# Patient Record
Sex: Female | Born: 1955 | Race: White | Hispanic: No | Marital: Married
Health system: Midwestern US, Community
[De-identification: ages and names within clinical notes are randomized; demographics above are authoritative.]

## PROBLEM LIST (undated history)

## (undated) DIAGNOSIS — R1312 Dysphagia, oropharyngeal phase: Secondary | ICD-10-CM

---

## 2015-10-15 ENCOUNTER — Encounter (HOSPITAL_BASED_OUTPATIENT_CLINIC_OR_DEPARTMENT_OTHER): Payer: Self-pay | Admitting: *Deleted

## 2015-10-15 ENCOUNTER — Emergency Department (HOSPITAL_BASED_OUTPATIENT_CLINIC_OR_DEPARTMENT_OTHER)
Admission: EM | Admit: 2015-10-15 | Discharge: 2015-10-15 | Disposition: A | Discharge status: Home / Self Care | Payer: BLUE CROSS/BLUE SHIELD | Attending: Physician Assistant | Admitting: Physician Assistant

## 2015-10-15 ENCOUNTER — Emergency Department (HOSPITAL_BASED_OUTPATIENT_CLINIC_OR_DEPARTMENT_OTHER): Payer: BLUE CROSS/BLUE SHIELD

## 2015-10-15 DIAGNOSIS — R55 Syncope and collapse: Secondary | ICD-10-CM | POA: Insufficient documentation

## 2015-10-15 DIAGNOSIS — R1110 Vomiting, unspecified: Secondary | ICD-10-CM | POA: Diagnosis not present

## 2015-10-15 DIAGNOSIS — R05 Cough: Secondary | ICD-10-CM | POA: Diagnosis present

## 2015-10-15 DIAGNOSIS — J189 Pneumonia, unspecified organism: Secondary | ICD-10-CM | POA: Insufficient documentation
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
## 2015-10-15 NOTE — ED Notes (Signed)
Pt reports cough x 1 week. Recent bus trip to [place] and cruise to [place]. Cough started after episode of possible aspiration

## 2015-10-15 NOTE — Discharge Instructions (Signed)
We think your cough is likely from inhaling a food product into your lung.  You may need to have Pulmonology do a scope to look at your lungs if you continue to cough.  We also recommend you establish care with a PCP. There are PCP in this office building that you can follow up with!  Return if cough continues,worsens or you have fever.

## 2016-03-04 ENCOUNTER — Emergency Department (HOSPITAL_BASED_OUTPATIENT_CLINIC_OR_DEPARTMENT_OTHER)
Admission: EM | Admit: 2016-03-04 | Discharge: 2016-03-04 | Disposition: A | Discharge status: Home / Self Care | Payer: BLUE CROSS/BLUE SHIELD | Attending: Emergency Medicine | Admitting: Emergency Medicine

## 2016-03-04 ENCOUNTER — Encounter (HOSPITAL_BASED_OUTPATIENT_CLINIC_OR_DEPARTMENT_OTHER): Payer: Self-pay | Admitting: *Deleted

## 2016-03-04 ENCOUNTER — Emergency Department (HOSPITAL_BASED_OUTPATIENT_CLINIC_OR_DEPARTMENT_OTHER): Payer: BLUE CROSS/BLUE SHIELD

## 2016-03-04 DIAGNOSIS — M545 Low back pain, unspecified: Secondary | ICD-10-CM

## 2016-03-04 DIAGNOSIS — R1110 Vomiting, unspecified: Secondary | ICD-10-CM | POA: Diagnosis not present

## 2016-03-04 MED ORDER — NAPROXEN 375 MG PO TABS: 375.0000 mg | ORAL_TABLET | Freq: Two times a day (BID) | ORAL | Status: AC

## 2016-03-04 MED ORDER — ONDANSETRON 4 MG PO TBDP: ORAL_TABLET | ORAL | Status: AC

## 2016-03-04 MED ORDER — TRAMADOL HCL 50 MG PO TABS: 50.0000 mg | ORAL_TABLET | Freq: Four times a day (QID) | ORAL | Status: AC | PRN

## 2016-03-04 MED ORDER — KETOROLAC TROMETHAMINE 15 MG/ML IJ SOLN
15.0000 mg | Freq: Once | INTRAMUSCULAR | Status: AC
  Administered 2016-03-04: 15 mg via INTRAVENOUS
  Filled 2016-03-04: qty 1

## 2016-03-04 MED ORDER — FENTANYL CITRATE (PF) 100 MCG/2ML IJ SOLN
100.0000 ug | Freq: Once | INTRAMUSCULAR | Status: AC
  Administered 2016-03-04: 100 ug via INTRAVENOUS
  Filled 2016-03-04: qty 2

## 2016-03-04 MED ORDER — CYCLOBENZAPRINE HCL 10 MG PO TABS
10.0000 mg | ORAL_TABLET | Freq: Two times a day (BID) | ORAL | Status: AC | PRN
Start: 2016-03-04 — End: ?

## 2016-03-04 NOTE — ED Provider Notes (Signed)
CSN: [id number]     Arrival date & time 03/04/16  1725 History  By signing my name below, I, Rosario AdieWilliam Andrew Hiatt, attest that this documentation has been prepared under the direction and in the presence of Pricilla LovelessScott Munirah Doerner, MD.  Electronically Signed: Rosario AdieWilliam Andrew Hiatt, ED Scribe. 03/04/2016. 6:55 PM.   Chief Complaint  Patient presents with  . Back Pain   The history is provided by the patient. No language interpreter was used.   HPI Comments: Jessica Kent is a 60 y.o. female with no pertinent PMHx who presents to the Emergency Department complaining of gradual onset, gradually worsening, constant, 10/10, left-sided lower back pain onset approximately 10 hours PTA. Her pain radiates up her spine and into her buttocks. She has had one episode of emesis PTA, which she states is because of her pain. Pt reports that she has taken Motrin, an unknown muscle relaxant, and applied heat with no relief of her pain. She notes that her pain is slightly alleviated when she lays down and doesn't move. Her pain is exacerbated with positional changes and ambulation. No known trauma or injury to the area. She denies a hx of similar or chronic back pain. Pt denies nausea, vomiting, diarrhea, dysuria, hematuria, saddle paraesthesias, abdominal pain, weakness or numbness in her lower extremities, bowel or bladder incontinence, or fever.   History reviewed. No pertinent past medical history. History reviewed. No pertinent past surgical history. History reviewed. No pertinent family history. Social History  Substance Use Topics  . Smoking status: Never Smoker   . Smokeless tobacco: Never Used  . Alcohol Use: No   OB History    No data available     Review of Systems  Constitutional: Negative for fever.  Gastrointestinal: Positive for vomiting. Negative for nausea, abdominal pain and diarrhea.       -bowel incontinence  Genitourinary: Negative for dysuria and hematuria.       -urinary incontinence    Musculoskeletal: Positive for back pain.  Neurological: Negative for weakness and numbness.       -saddle paraesthesias   All other systems reviewed and are negative.  Allergies  Codeine and Morphine and related  Home Medications   Prior to Admission medications   Not on File   BP 120/77 mmHg  Pulse 86  Temp(Src) 97.7 F (36.5 C) (Oral)  Resp 20  Ht 5\' 2"  (1.575 m)  Wt 160 lb (72.576 kg)  BMI 29.26 kg/m2  SpO2 96%   Physical Exam  Constitutional: She is oriented to person, place, and time. She appears well-developed and well-nourished.  HENT:  Head: Normocephalic and atraumatic.  Right Ear: External ear normal.  Left Ear: External ear normal.  Nose: Nose normal.  Eyes: Right eye exhibits no discharge. Left eye exhibits no discharge.  Cardiovascular: Normal rate, regular rhythm and normal heart sounds.   Pulmonary/Chest: Effort normal and breath sounds normal.  Abdominal: Soft. There is no tenderness.  Musculoskeletal: She exhibits tenderness.       Lumbar back: She exhibits tenderness. She exhibits no bony tenderness.       Back:  Neurological: She is alert and oriented to person, place, and time. She displays no Babinski's sign on the right side. She displays no Babinski's sign on the left side.  Reflex Scores:      Patellar reflexes are 2+ on the right side and 2+ on the left side.      Achilles reflexes are 2+ on the right side and 2+ on the  left side. 5/5 strength in bilateral lower extremeties. Pts LLE extremity is slightly weaker, this seems to be due to pain. Normal gross sensation.   Skin: Skin is warm and dry.  Nursing note and vitals reviewed.  ED Course  Procedures (including critical care time)  DIAGNOSTIC STUDIES: Oxygen Saturation is 99% on RA, normal by my interpretation.   COORDINATION OF CARE: 6:50 PM-Discussed next steps with pt including DG lumbar spine and pain management medications. Pt verbalized understanding and is agreeable with the  plan.   Imaging Review Dg Lumbar Spine Complete  03/04/2016  CLINICAL DATA:  Low back pain since 8 a.m. LEFT-sided low back pain. EXAM: LUMBAR SPINE - COMPLETE 4+ VIEW COMPARISON:  None. FINDINGS: Normal alignment of the lumbar vertebral bodies. There is joint space narrowing at L3-L4, L4-L5 and L5-S1. There is mild endplate spurring. No subluxation. No pars fracture. IMPRESSION: 1. No acute findings lumbar spine. 2. Mild disc osteophytic disease. Electronically Signed   By: Genevive BiStewart  Edmunds M.D.   On: 03/04/2016 19:45    I have personally reviewed and evaluated these images as part of my medical decision-making.  MDM   Final diagnoses:  Acute low back pain    Patient is feeling much better. No clear etiology for acute low back pain. Neuro exam is benign. Given no history of chronic pain and age, Herby AbrahamXray obtained for screening purposes. No obvious etiology. Will treat with narcotics (has codeine allergy, will try tramadol with zofran as needed), NSAIDs, and muscle relaxers. F/u with PCP. Return if symptoms worsen.  I personally performed the services described in this documentation, which was scribed in my presence. The recorded information has been reviewed and is accurate.     Pricilla LovelessScott Suliman Termini, MD 03/05/16 [phone]

## 2016-03-04 NOTE — ED Notes (Signed)
Per pt report ongoing lower back pain since this morning, no n/v/d. No fever. Used motrin and heat with no change.

## 2016-03-04 NOTE — ED Notes (Signed)
Pt sat fell to 88% on RA following Fentanyl. Applied 2 L Chauncey with improvement to 96%.

## 2016-03-04 NOTE — ED Notes (Signed)
MD at bedside. 

## 2017-03-24 IMAGING — DX DG LUMBAR SPINE COMPLETE 4+V
5 series · 5 of 5 positions shown · non-contrast
Comparison: None.

CLINICAL DATA: Low back pain since 8 a.m.. LEFT-sided low back
pain.

EXAM:
LUMBAR SPINE - COMPLETE 4+ VIEW

[l-spine ap]
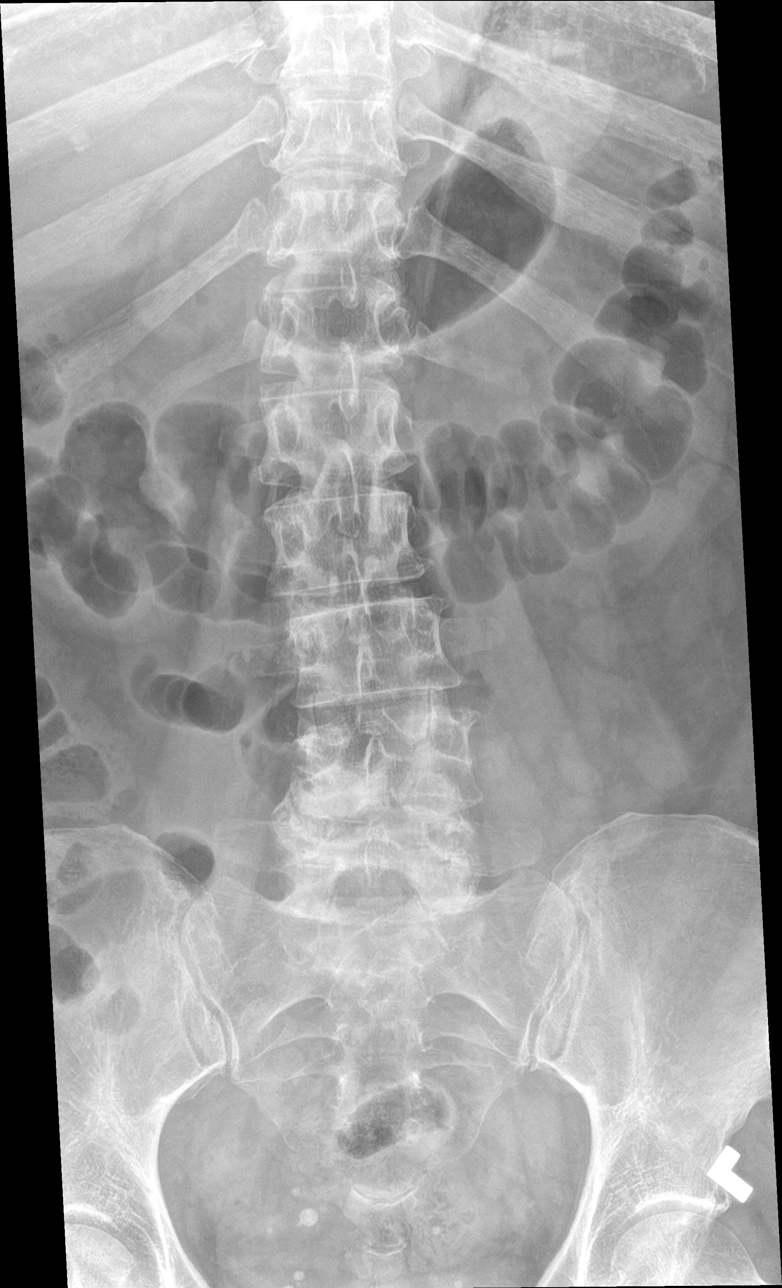

[l-spine obl (1 of 2)]
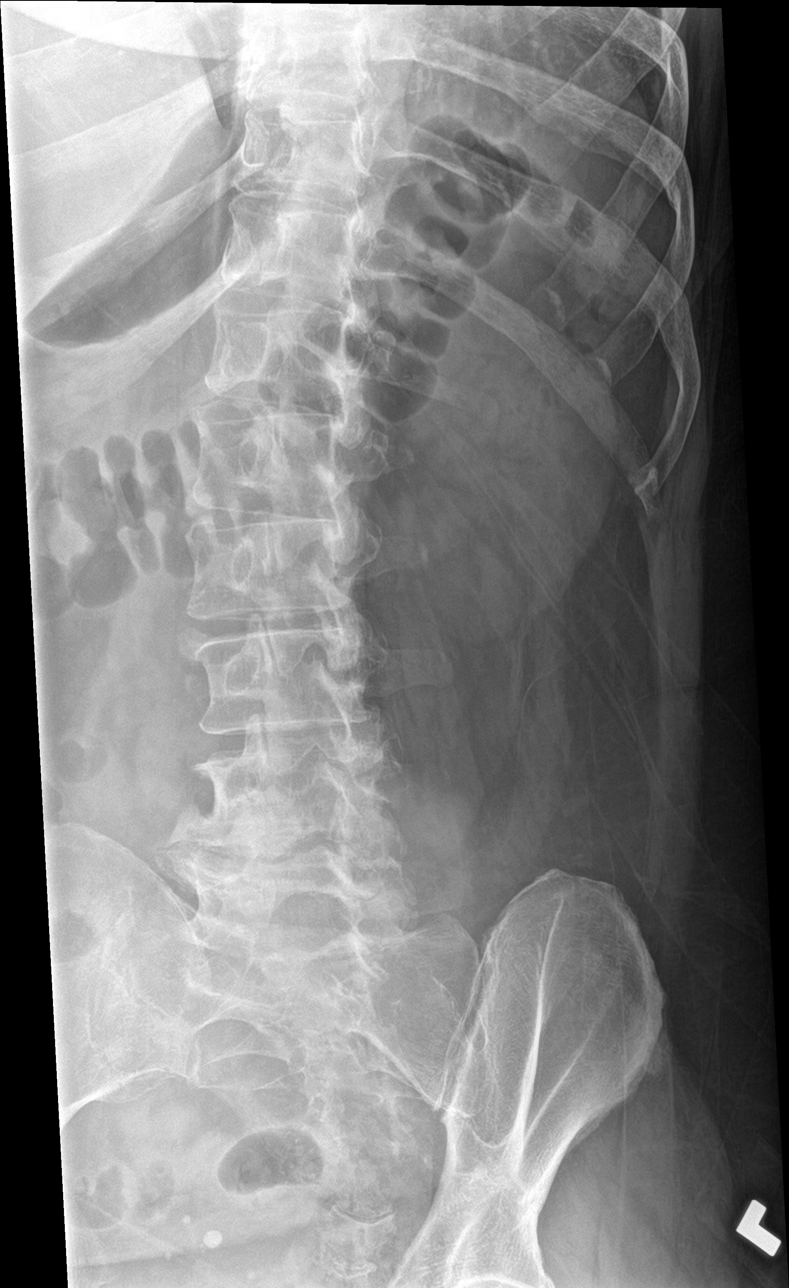

[l-spine obl (2 of 2)]
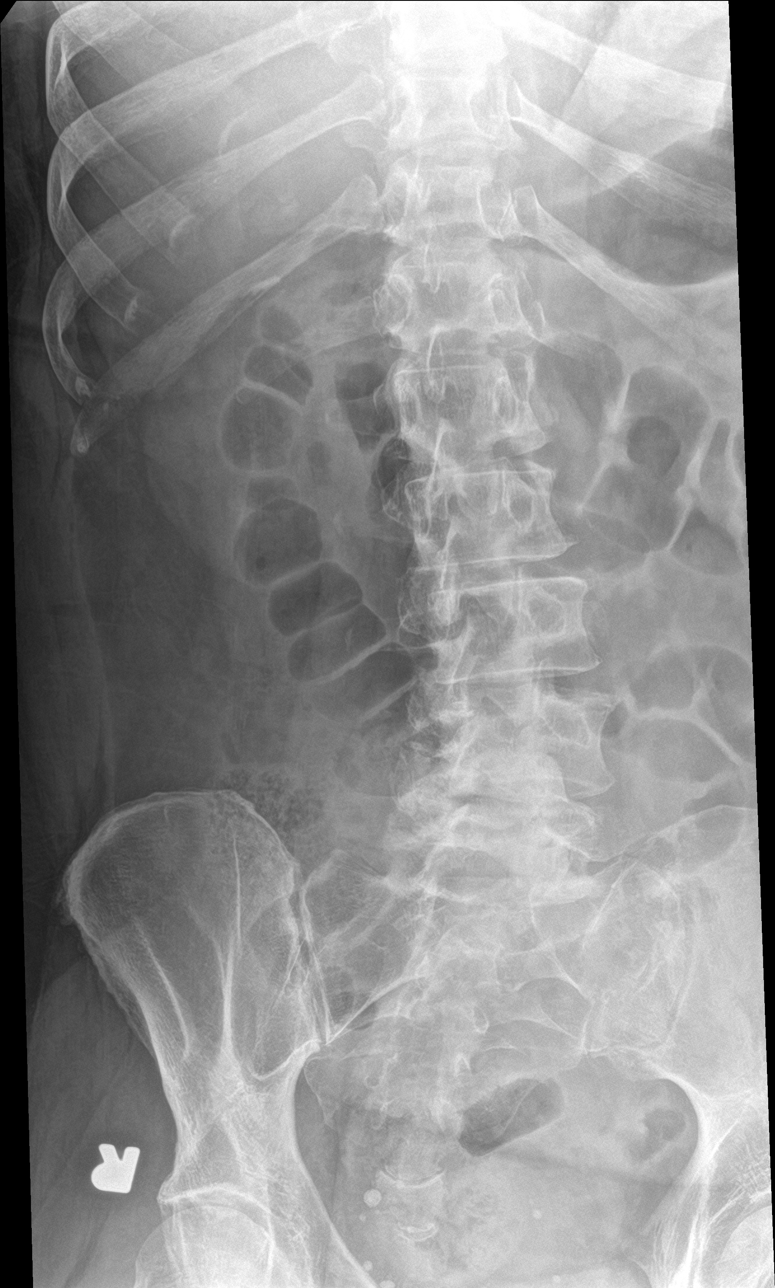

[l-spine lat]
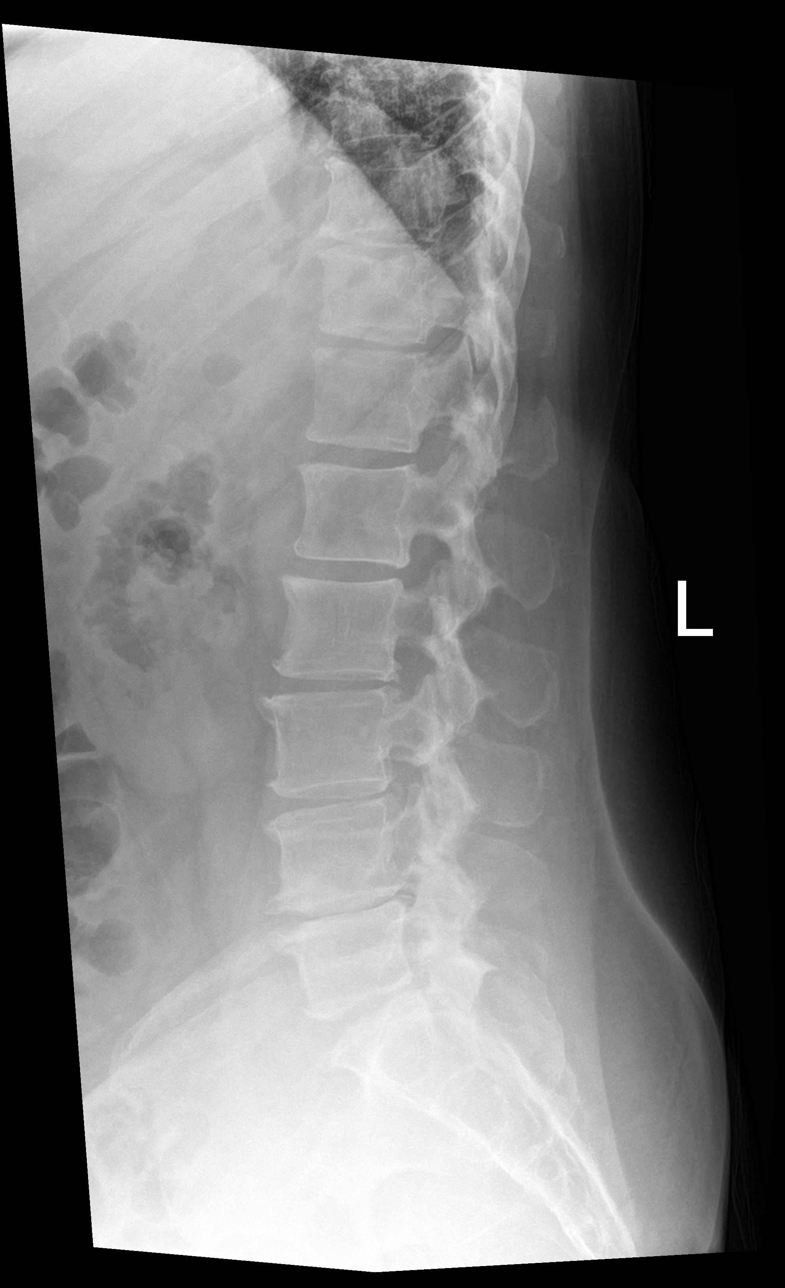

[l-spine spot]
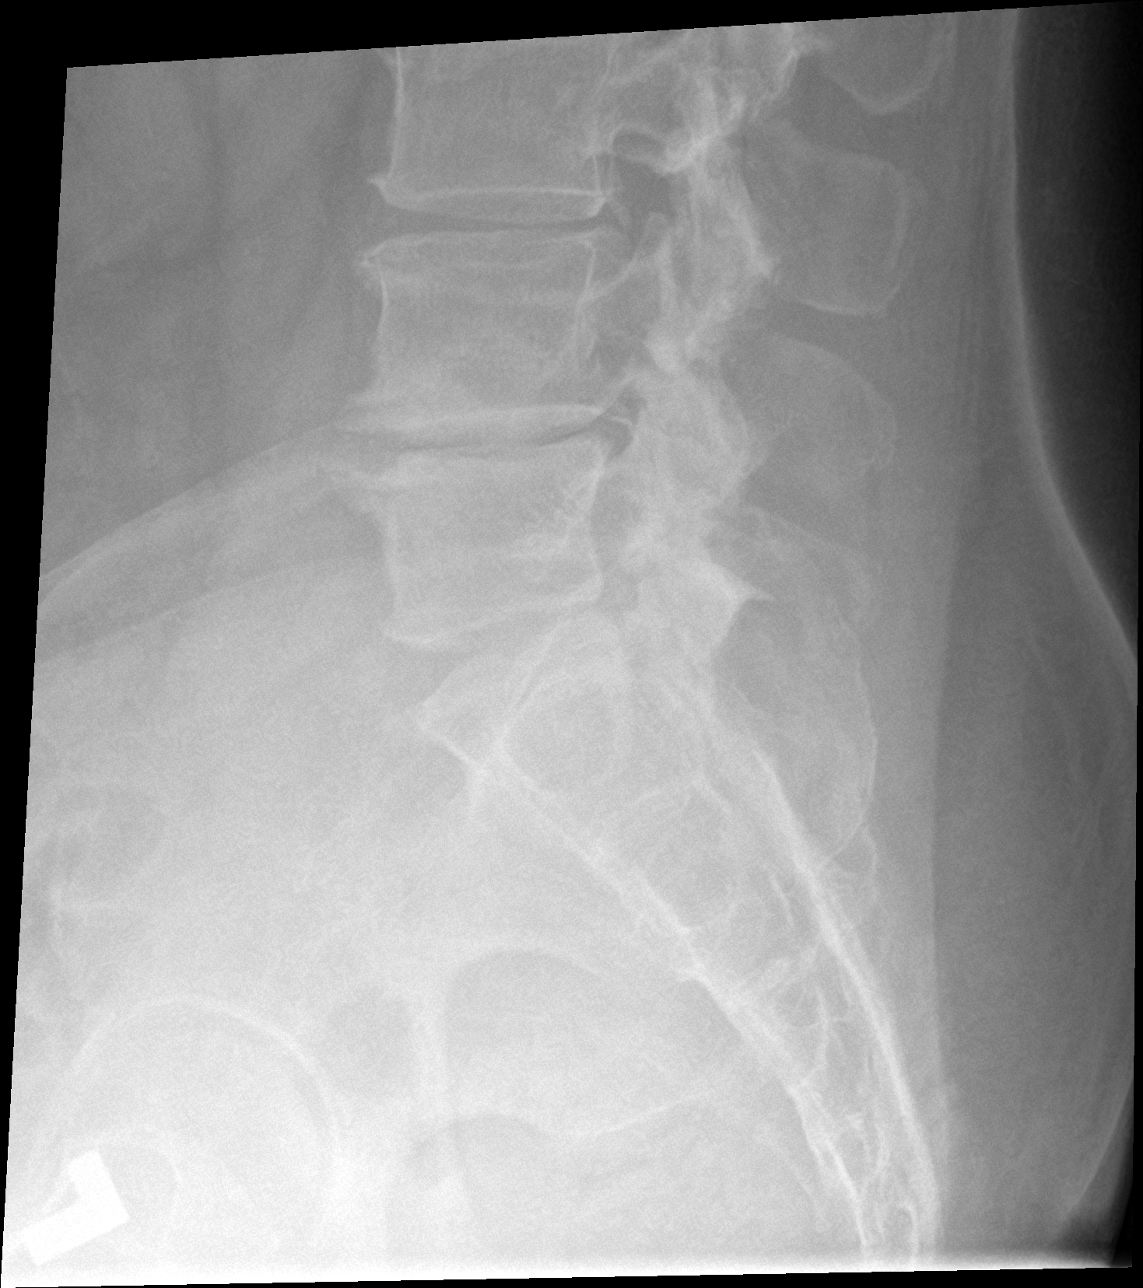

[5 of 5 positions shown; findings below may reference images not displayed]

FINDINGS: Normal alignment of the lumbar vertebral bodies. There is joint
space narrowing at L3-L4, L4-L5 and L5-S1. There is mild endplate
spurring. No subluxation. No pars fracture.
IMPRESSION: 1. No acute findings lumbar spine.
2. Mild disc osteophytic disease.

## 2020-03-01 ENCOUNTER — Encounter

## 2020-03-01 ENCOUNTER — Encounter: Payer: MEDICARE | Primary: Student in an Organized Health Care Education/Training Program

## 2020-03-01 ENCOUNTER — Inpatient Hospital Stay: Admit: 2020-03-01 | Payer: MEDICARE | Primary: Student in an Organized Health Care Education/Training Program

## 2020-03-01 DIAGNOSIS — R1312 Dysphagia, oropharyngeal phase: Secondary | ICD-10-CM

## 2020-03-01 MED ORDER — BARIUM SULFATE 40 % (W/V), 30% (W/W) ORAL PASTE
40 % (w/v), 30% (w/w) | Freq: Once | ORAL | Status: AC
Start: 2020-03-01 — End: 2020-03-01
  Administered 2020-03-01: 13:00:00 via ORAL

## 2020-03-01 MED ORDER — BARIUM SULFATE 98 % ORAL SUSP, RECON
98 % | Freq: Once | ORAL | Status: AC
Start: 2020-03-01 — End: 2020-03-01
  Administered 2020-03-01: 13:00:00 via ORAL

## 2020-03-01 NOTE — Other (Signed)
Therapy Evaluation by Ihor Austin, SLP at 03/01/20 0900                Author: Ihor Austin, SLP  Service: Speech Language Pathologist  Author Type: Speech Language Pathologist       Filed: 03/01/20 1424  Date of Service: 03/01/20 0900  Status: Signed          Editor: Loescher, Rejeana Brock, SLP (Speech Language Pathologist)                   Elizabeth Chaney   DOB: [birth year]   Primary: [place] [employer]   Secondary:   [place] [facility]   [address], [address], [place] [address]   Phone:[phone]   [phone]               OUTPATIENT SPEECH LANGUAGE PATHOLOGY:  MODIFIED BARIUM SWALLOW      ICD-10 : Treatment Diagnosis: dysphagia, oropharyngeal R 13.12   DATE: 03/01/2020   REFERRING PHYSICIAN:  Legrand Rams, MD   MD Orders: modified barium swallow study    PAST MEDICAL HISTORY:     Elizabeth Chaney  is a 64 y.o. female  who  has no past medical history on file.   She also  has no past surgical history on file.   MEDICAL/REFERRING DIAGNOSIS:  Dysphagia, oropharyngeal phase [R13.12]   DATE OF ONSET:  March 2021    PRIOR LEVEL OF FUNCTION: with spouse    PRECAUTIONS/ALLERGIES:  Patient has no known allergies.      ASSESSMENT/PLAN OF CARE:   Pt stated she can't swallow her food due to "jaw tremors".  She reported this has been ongoing for 3-4 months and that she has  lost 30 something pounds as a result.  She reported consuming a liquid diet.   She reported no signs/sx aspiration with po.   She reported she is currently receiving home health ST.      Based on the objective data described below, the patient presents with mild oropharyngeal dysphagia .  Prolonged mastication observed with mixed consistencies and solids.  Pt also with mild oral delays with pudding.  Piece meal deglutition observed with pudding, mixed and solids.  Pt stated  twice she couldn't swallow the pudding and mixed consistency though the trials were still on her tongue.  She hadn't  made any attempts to propel them when she made that statement.  Once she propelled the trials, swallowing was triggered either at the  valleculae or pyriforms.  Trials were swallowed without penetration or aspiration.  She also stated once that she couldn't swallow pudding but there was only a trace amount along her velum and no pharyngeal residue.  Deep penetration occurred with thin  liquids via straw due to premature spillage.  No penetration with thin via cup even when taking consecutive sips.  No penetration with other consistencies.  No pharyngeal residue with any consistency.  Informed pt and her spouse it is felt the pt can  tolerate a mech soft diet with thin liquids and no straws.  The pt stated, "Well I'm not eating".  Informed pt if she continues to consume a liquid diet and exhibit weight loss they may need to discuss alternate means of nutrition.  Encouraged her to  begin with pureed or pudding textures until she feels confident with trying chewable textures.  Understanding expressed.  Recommend home health ST to address pharyngeal deficits.  Pt/spouse in  agreement.      Patient will benefit from skilled intervention to address the above impairments.      RECOMMENDATIONS AND PLANNED INTERVENTIONS  (Benefits and precautions of therapy have been discussed with the patient.):     PO:  Pureed with advancement to mech soft as pt feels comfortable     Liquids:  regular thin   MEDICATIONS:     With liquid   COMPENSATORY STRATEGIES/MODIFICATIONS INCLUDING :     Small sips and bites   OTHER RECOMMENDATIONS  (including follow up treatment recommendations) :      Laryngeal exercises      Thank you for this referral,   Rejeana Brock Loescher, MSP, CCC-SLP                  SUBJECTIVE:   Pt cooperative.     Present Symptoms: pt reported inability to swallow due to tremors         Current Dietary Status:   Liquid diet    Radiologist: Dr. Ronie Spies   History of reflux:  [] YES     [] NO        Reflux medication:   Social  History/Home Situation:  with spouse       Work/Activity History:  retired       OBJECTIVE:   Objective Measure:   Tool Used: National Outcomes Measurement System: Functional Communication Measures:  SWALLOWING       Score:   Initial: 5  Most Recent: X (Date: -- )     Interpretation of Tool: This measure describes the change in functional  communication status subsequent to speech-language pathology treatment of patients with dysphagia.   o  Level 1:   Individual is not able to swallow anything safely by mouth. All nutrition and hydration is received through non-oral means (e.g., nasogastric tube, PEG).   o  Level 2:  Individual is not able to swallow safely by mouth  for nutrition and hydration, but may take some consistency with consistent maximal cues in therapy only. Alternative method of feeding required.   o  Level 3:  Alternative method of feeding required as individual takes less than 50% of nutrition and hydration by mouth, and/or swallowing is safe with consistent use of moderate cues to use  compensatory strategies and/or requires maximum diet restriction.   o  Level 4:  Swallowing is safe, but usually requires moderate cues to use compensatory strategies, and/or the individual has  moderate diet restrictions and/or still requires tube feeding and/or oral supplements.   o  Level 5:  Swallowing is safe with minimal diet restriction and/or occasionally requires minimal cueing to use compensatory strategies. The individual may occasionally self-cue. All nutrition  and hydration needs are met by mouth at mealtime.   o  Level 6:  Swallowing is safe, and the individual eats and drinks independently and may rarely require minimal cueing. The individual usually self-cues when difficulty occurs. May need to avoid  specific food items (e.g., popcorn and nuts), or require additional time (due to dysphagia).   o  Level 7:  The individuals ability to eat independently is not limited by swallow function.  Swallowing  would be safe and efficient for all consistencies. Compensatory strategies are  effectively used when needed.            Score  Level 7  Level 6  Level 5  Level 4  Level 3  Level 2  Level 1     Modifier  CH  CI  CJ  CK  CL  CM  CN           Cognitive/Communication Status:  Mental Status   Neurologic State: Alert      Oral Assessment:  Oral Assessment   Dentition: Intact, Natural   Oral Hygiene: adequate   Lingual:  (tremors observed with movement and at rest)      Vocal Quality: no impairment      Patient Viewed:  Patient Position: upright in chair   Film Views: Lateral, Fluoro      Oral Prepatory:   The patient was given the following: Consistency Presented: Mixed consistency, Pudding, Solid, Thin liquid   How Presented: Self-fed/presented, SLP-fed/presented, Cup/sip, Spoon, Straw, Successive swallows      Oral Phase:   Bolus Acceptance: No impairment   Bolus Formation/Control: Impaired   Propulsion: Delayed (# of seconds)   Type of Impairment: Delayed, Premature spillage, Mastication   Oral Residue: None   Initiation of Swallow: Triggered at vallecula, Triggered at pyriform sinus(es)   Oral Phase Severity: Mild      Pharyngeal Phase:   Timing: Pooling 1-5 sec   Decreased Tongue Base Retraction?: No   Laryngeal Elevation: Incomplete laryngeal closure   Penetration: During swallow   Aspiration/Timing: No evidence of aspiration   Aspiration/Penetration Score: 3 (Penetration/Visible residue-Contrast remains above the folds/cords, but is not cleared)   Pharyngeal Symmetry: Not assessed   Pharyngeal Dysfunction: Decreased elevation/closure   Pharyngeal Phase Severity: Mild   Pharyngeal-Esophageal Segment: No impairment      Assessment only; No treatment(s) provided today   _________________________________________________________________________________________ _________   Recommendations for treatment:  laryngeal exercises   Total Treatment Duration:   Time In: 0905    Time Out: [place], MSP,  CCC-SLP

## 2020-03-17 ENCOUNTER — Inpatient Hospital Stay: Admit: 2020-03-17 | Primary: Student in an Organized Health Care Education/Training Program
